# Patient Record
Sex: Female | Born: 1985 | Race: White | Hispanic: No | Marital: Married | Smoking: Never smoker
Health system: Southern US, Community
[De-identification: ages and names within clinical notes are randomized; demographics above are authoritative.]

## PROBLEM LIST (undated history)

## (undated) DIAGNOSIS — K219 Gastro-esophageal reflux disease without esophagitis: Secondary | ICD-10-CM

## (undated) DIAGNOSIS — Z8719 Personal history of other diseases of the digestive system: Secondary | ICD-10-CM

---

## 2014-09-08 ENCOUNTER — Encounter (HOSPITAL_COMMUNITY): Payer: Self-pay

## 2014-09-08 ENCOUNTER — Other Ambulatory Visit (HOSPITAL_COMMUNITY): Payer: Self-pay | Admitting: Specialist

## 2014-09-08 ENCOUNTER — Ambulatory Visit (HOSPITAL_COMMUNITY)
Admission: RE | Admit: 2014-09-08 | Discharge: 2014-09-08 | Disposition: A | Discharge status: Home / Self Care | Payer: BLUE CROSS/BLUE SHIELD | Source: Ambulatory Visit | Attending: Specialist | Admitting: Specialist

## 2014-09-08 DIAGNOSIS — Z36 Encounter for antenatal screening of mother: Secondary | ICD-10-CM | POA: Insufficient documentation

## 2014-09-08 DIAGNOSIS — O358XX0 Maternal care for other (suspected) fetal abnormality and damage, not applicable or unspecified: Secondary | ICD-10-CM | POA: Insufficient documentation

## 2014-09-08 DIAGNOSIS — Z3A20 20 weeks gestation of pregnancy: Secondary | ICD-10-CM | POA: Diagnosis not present

## 2014-09-08 DIAGNOSIS — IMO0002 Reserved for concepts with insufficient information to code with codable children: Secondary | ICD-10-CM

## 2014-09-08 DIAGNOSIS — O35BXX0 Maternal care for other (suspected) fetal abnormality and damage, fetal cardiac anomalies, not applicable or unspecified: Secondary | ICD-10-CM

## 2014-09-08 DIAGNOSIS — Z0489 Encounter for examination and observation for other specified reasons: Secondary | ICD-10-CM

## 2014-09-08 DIAGNOSIS — Z3689 Encounter for other specified antenatal screening: Secondary | ICD-10-CM | POA: Insufficient documentation

## 2014-09-11 ENCOUNTER — Other Ambulatory Visit (HOSPITAL_COMMUNITY): Payer: Self-pay

## 2014-09-15 ENCOUNTER — Encounter: Payer: Self-pay | Admitting: Specialist

## 2014-09-18 ENCOUNTER — Other Ambulatory Visit (HOSPITAL_COMMUNITY): Payer: Self-pay

## 2014-09-18 ENCOUNTER — Telehealth (HOSPITAL_COMMUNITY): Payer: Self-pay | Admitting: MS"

## 2014-09-18 NOTE — Telephone Encounter (Signed)
Called Maria Landry to discuss her cell free fetal DNA test results.  Mrs. Maria Landry had [facility] testing through [facility].  Testing was offered because of the ultrasound finding of fetal heart defect.   The patient was identified by name and DOB.  We reviewed that these are within normal limits, showing a less than 1 in 10,000 risk for trisomies 21, 18 and 13, and monosomy X (Turner syndrome).  In addition, the risk for triploidy/vanishing twin and sex chromosome trisomies (47,XXX and 47,XXY) was also low risk.  Mrs. Maria Landry elected to have cffDNA analysis for 22q11 deletion syndrome, which was also low risk (1 in 13,300).  We reviewed that this testing identifies > 99% of pregnancies with trisomy 8221, trisomy 8213, sex chromosome trisomies (47,XXX and 47,XXY), and triploidy. The detection rate for trisomy 18 is 96%.  The detection rate for monosomy X is ~92%.  The false positive rate is <0.1% for all conditions. Testing was also consistent with female fetal sex. She understands that this testing does not identify all genetic conditions.   Ms. Maria Landry [facility] fetal [facility]. She reported that this exam also visualized that "the atrial septum did not form, that there is one big valve instead of two, and that there may be a small hole between the other two septums", but that they were not able to see this well at that time. She stated that she has a follow-up planned with [facility] in 6 weeks. We reviewed that follow-up ultrasounds for fetal growth were also recommended at the time of her ultrasound in our office and that she can discuss with her OB if they would like for those to be performed through her OB office or our office. All questions were answered to her satisfaction, she was encouraged to call with additional questions or concerns.  Maria PlowmanKaren Ema Hebner, MS Patent attorneyCertified Genetic Counselor

## 2015-07-14 ENCOUNTER — Encounter (HOSPITAL_COMMUNITY): Payer: Self-pay | Admitting: *Deleted

## 2015-08-04 IMAGING — US US OB DETAIL+14 WK
1 series · 12 of 28 positions shown · non-contrast
Comparison: none

[Series 1: us ob detail+14 wk · 0.25mm/px · 12 of 100 slices shown]
[im 4/100]
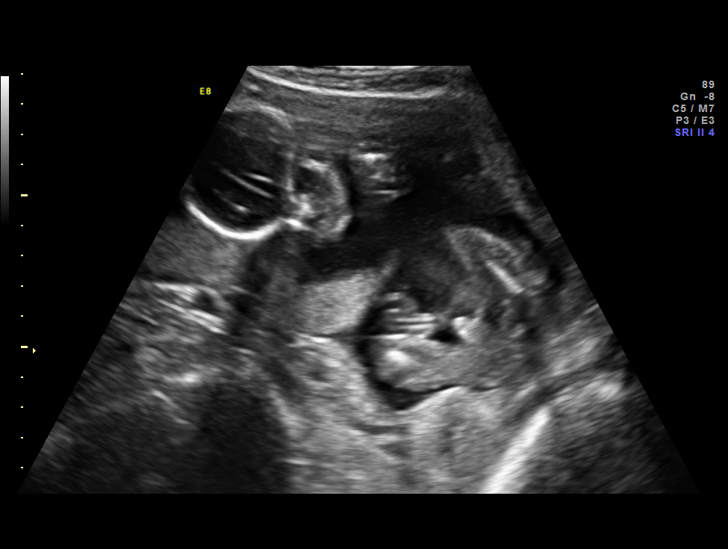
[im 12/100]
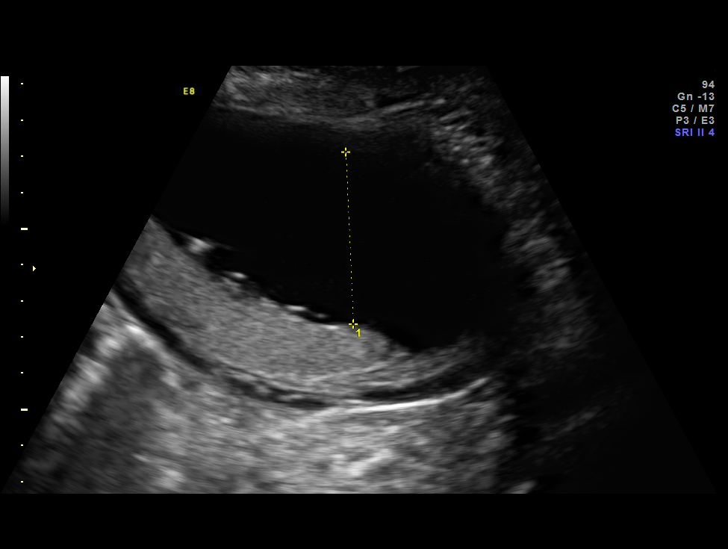
[im 19/100]
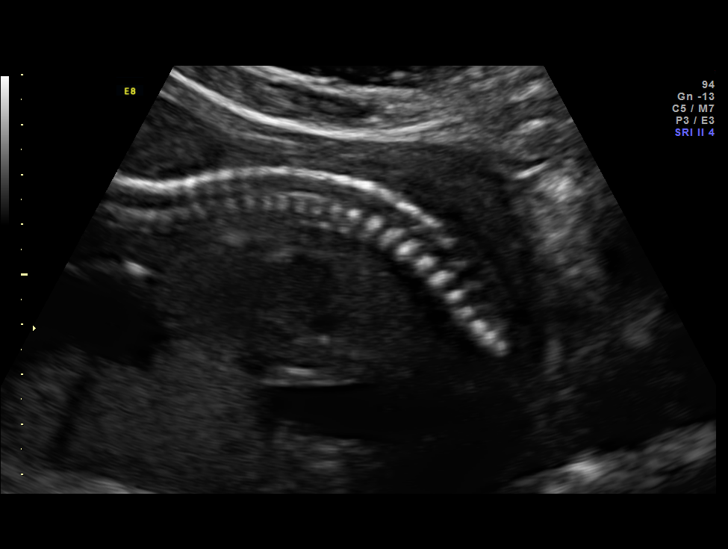
[im 30/100]
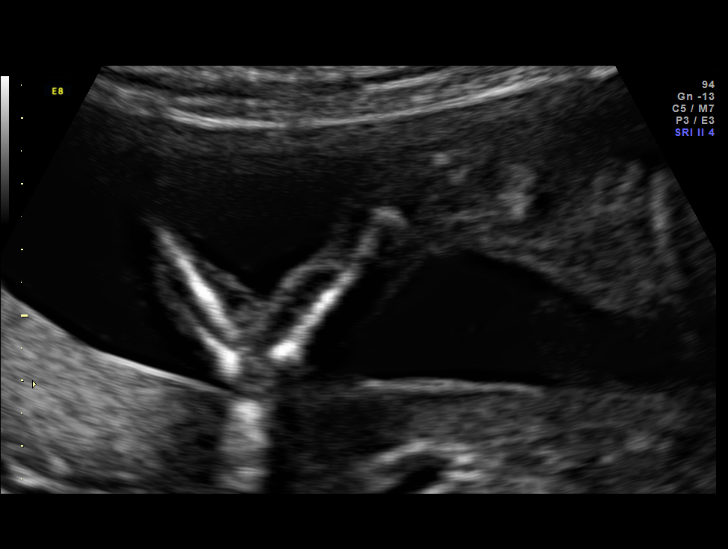
[im 37/100]
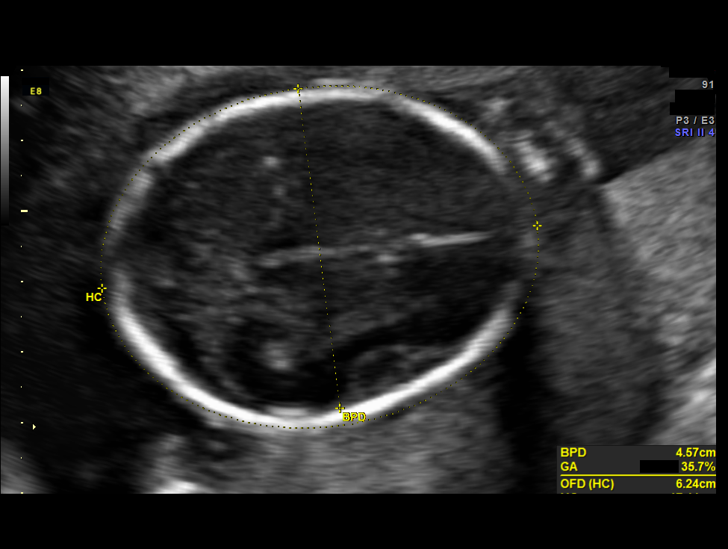
[im 45/100]
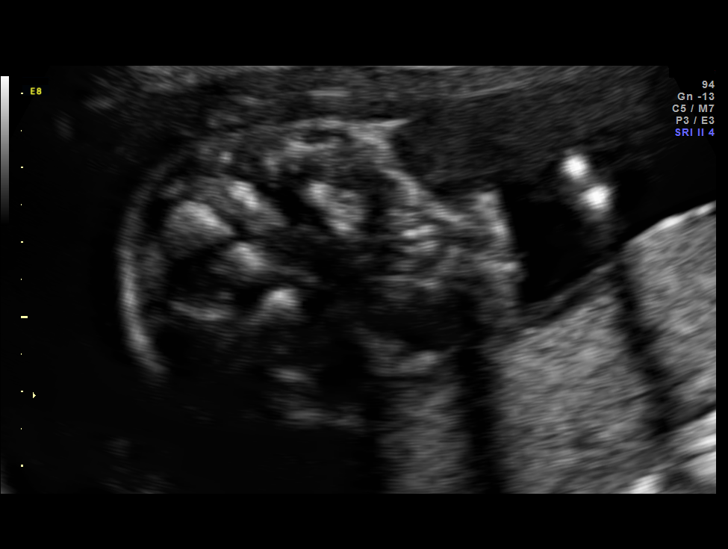
[im 56/100]
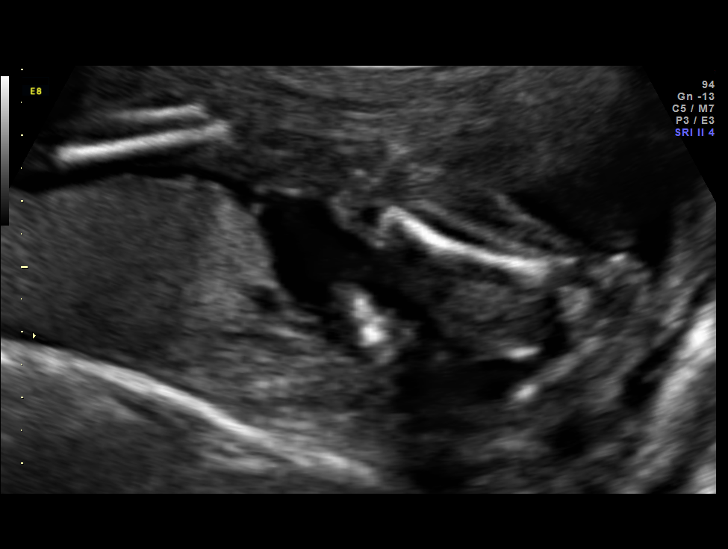
[im 63/100]
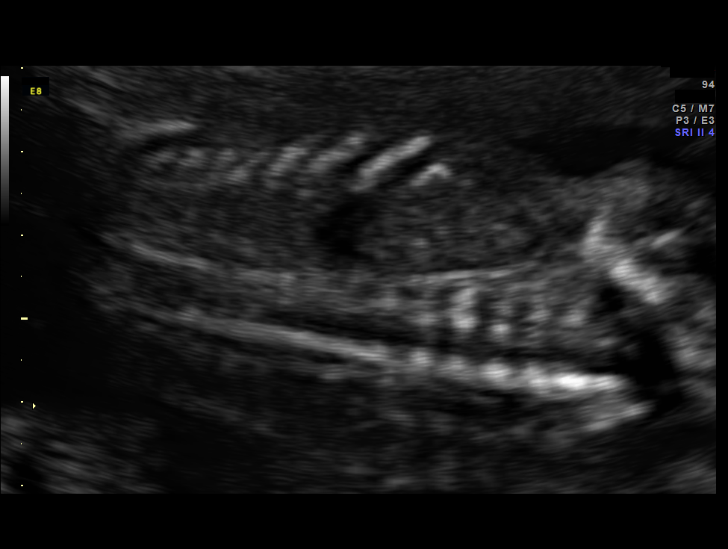
[im 70/100]
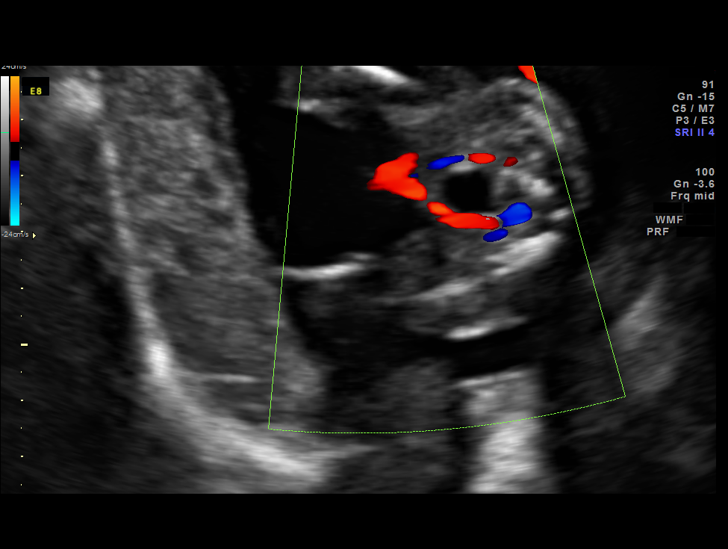
[im 81/100]
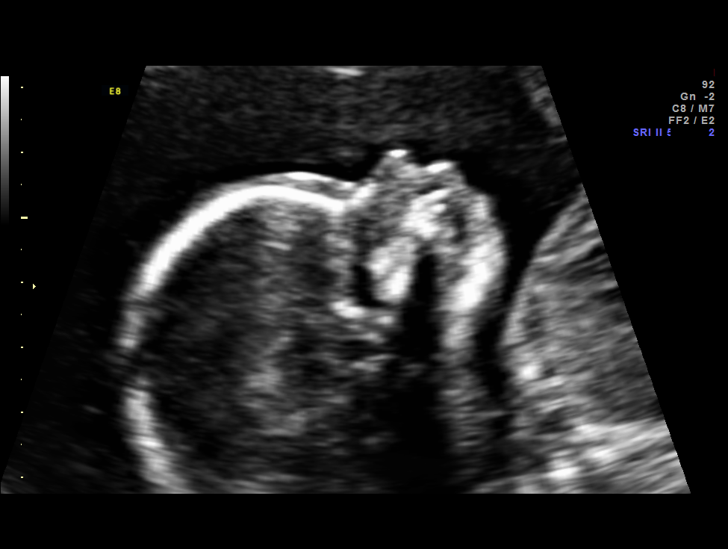
[im 89/100]
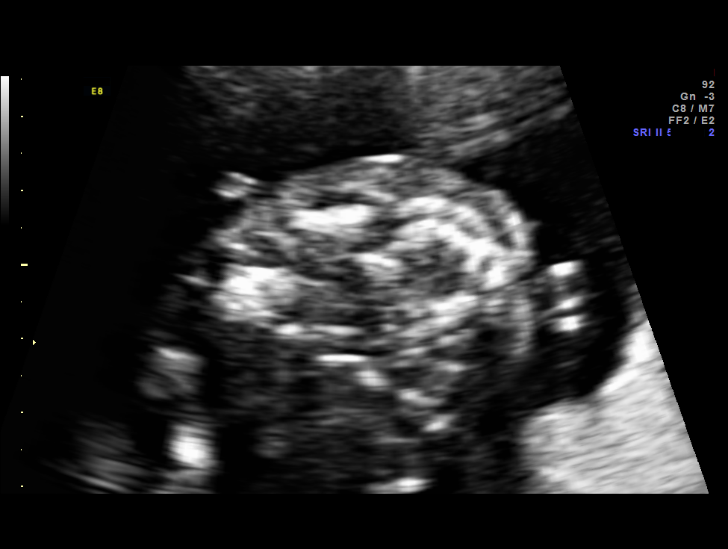
[im 96/100]
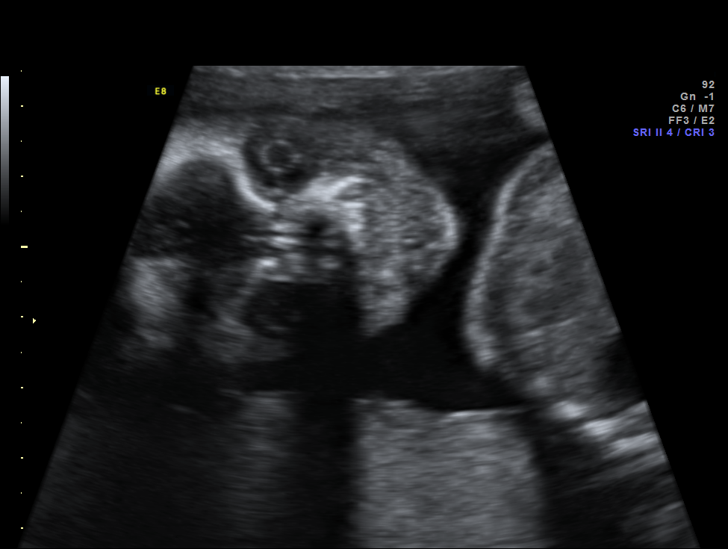

[12 of 28 positions shown; findings below may reference images not displayed]

OBSTETRICS REPORT
                      (Signed Final 09/08/2014 [DATE])

Service(s) Provided

 US OB DETAIL + 14 WK                                  76811.0
Indications

 Detailed fetal anatomic survey                        Z36
 Fetal abnormality - other known or suspected
 (heart defect)
 20 weeks gestation of pregnancy
Fetal Evaluation

 Num Of Fetuses:    1
 Fetal Heart Rate:  159                          bpm
 Cardiac Activity:  Observed
 Presentation:      Breech
 Placenta:          Posterior, above cervical
                    os
 P. Cord            Visualized
 Insertion:

 Amniotic Fluid
 AFI FV:      Subjectively within normal limits
                                             Larg Pckt:     4.8  cm
Biometry

 BPD:     46.3  mm     G. Age:  20w 0d                CI:         74.2   70 - 86
 OFD:     62.4  mm                                    FL/HC:      17.5   16.8 -

 HC:     175.7  mm     G. Age:  20w 0d       39  %    HC/AC:      1.14   1.09 -

 AC:     154.7  mm     G. Age:  20w 5d       61  %    FL/BPD:
 FL:      30.8  mm     G. Age:  19w 4d       22  %    FL/AC:      19.9   20 - 24
 HUM:     30.4  mm     G. Age:  20w 0d       50  %
 CER:     21.7  mm     G. Age:  20w 5d       58  %

 Est. FW:     334  gm    0 lb 12 oz      49  %
Gestational Age

 LMP:           22w 2d        Date:  04/05/14                 EDD:   01/10/15
 U/S Today:     20w 0d                                        EDD:   01/26/15
 Best:          20w 1d     Det. By:  Early Ultrasound         EDD:   01/25/15
                                     (06/15/14)
Anatomy

 Cranium:          Appears normal         Aortic Arch:      Appears normal
 Fetal Cavum:      Appears normal         Ductal Arch:      Not well visualized
 Ventricles:       Appears normal         Diaphragm:        Appears normal
 Choroid Plexus:   Appears normal         Stomach:          Appears normal, left
                                                            sided
 Cerebellum:       Appears normal         Abdomen:          Appears normal
 Posterior Fossa:  Appears normal         Abdominal Wall:   Appears nml (cord
                                                            insert, abd wall)
 Nuchal Fold:      Not applicable (>20    Cord Vessels:     Appears normal (3
                   wks GA)                                  vessel cord)
 Face:             Appears normal         Kidneys:          Appear normal
                   (orbits and profile)
 Lips:             Appears normal         Bladder:          Appears normal
 Palate:           Appears normal         Spine:            Appears normal
 Heart:            Abnml 4 chamber,       Lower             Appears normal
                   see comments
                                          Extremities:
 RVOT:             Appears normal         Upper             Appears normal
                                          Extremities:
 LVOT:             Appears normal

 Other:  Heels and 5th digit appear normal. Fetus appears to be a male.
Cervix Uterus Adnexa

 Cervical Length:    3.1      cm

 Cervix:       Normal appearance by transabdominal scan. Appears
               closed, without funnelling.
 Left Ovary:    Within normal limits.
 Right Ovary:   Not visualized. No adnexal mass visualized.
Impression

 SIUP at 20+1 weeks
 Congenital heart defect: at least an ASD but findings
 suspicious for AV canal defect; appeared to be an isolated
 anomaly
 All other detailed fetal anatomy was seen and appeared
 normal
 Other markers of aneuploidy: none
 Normal amniotic fluid volume
 Measurements consistent with LMP dating

 The US findings were shared with Ms. Fojleta.  The
 implications of the above heart defect were discussed in
 detail. They understand that diagnosis needs to be verified by
 a pediatric cardiologist and at that point a plan will be made
 for delivery, follow-up and surgery. Overall prognosis is very
 good. All of their prenatal testing and pregnancy
 management options were reviewed.  After careful
 consideration, she declined amniocentesis but opted for cell
 free DNA screening for AJB,BR and 13 + 77q11 ([facility]).
 They wanted to work with [REDACTED] we will arrange a fetal
 ECHO appt with [facility] (no appts in [REDACTED]
 next week so will contact their office in [place]).
Recommendations

 Fetal ECHO and delivery location recommendations per
 [facility]
 Serial [place] for growth (
 q 6 weeks); we would be happy to
 see them again!

## 2017-04-02 ENCOUNTER — Other Ambulatory Visit: Payer: Self-pay | Admitting: Gastroenterology

## 2017-04-02 DIAGNOSIS — R1011 Right upper quadrant pain: Secondary | ICD-10-CM

## 2017-04-02 NOTE — Progress Notes (Signed)
Maria Guice MD 

## 2017-04-09 ENCOUNTER — Encounter (HOSPITAL_COMMUNITY): Payer: Self-pay | Admitting: Radiology

## 2017-04-09 ENCOUNTER — Encounter (HOSPITAL_COMMUNITY)
Admission: RE | Admit: 2017-04-09 | Discharge: 2017-04-09 | Disposition: A | Discharge status: Home / Self Care | Payer: BLUE CROSS/BLUE SHIELD | Source: Ambulatory Visit | Attending: Gastroenterology | Admitting: Gastroenterology

## 2017-04-09 DIAGNOSIS — R1011 Right upper quadrant pain: Secondary | ICD-10-CM | POA: Insufficient documentation

## 2017-04-09 MED ORDER — TECHNETIUM TC 99M MEBROFENIN IV KIT
5.5000 | PACK | Freq: Once | INTRAVENOUS | Status: AC | PRN
  Administered 2017-04-09: 5.5 via INTRAVENOUS

## 2017-06-17 HISTORY — PX: CHOLECYSTECTOMY: SHX55

## 2018-05-18 ENCOUNTER — Encounter (HOSPITAL_COMMUNITY): Payer: Self-pay

## 2018-05-24 ENCOUNTER — Other Ambulatory Visit (HOSPITAL_COMMUNITY): Payer: Self-pay | Admitting: Specialist

## 2018-05-24 DIAGNOSIS — O09299 Supervision of pregnancy with other poor reproductive or obstetric history, unspecified trimester: Secondary | ICD-10-CM

## 2018-05-24 DIAGNOSIS — Z3A19 19 weeks gestation of pregnancy: Secondary | ICD-10-CM

## 2018-05-24 DIAGNOSIS — Z3689 Encounter for other specified antenatal screening: Secondary | ICD-10-CM

## 2018-06-29 ENCOUNTER — Encounter (HOSPITAL_COMMUNITY): Payer: Self-pay

## 2018-07-05 ENCOUNTER — Encounter (HOSPITAL_COMMUNITY): Payer: Self-pay | Admitting: *Deleted

## 2018-07-06 ENCOUNTER — Ambulatory Visit (HOSPITAL_COMMUNITY)
Admission: RE | Admit: 2018-07-06 | Discharge: 2018-07-06 | Disposition: A | Discharge status: Home / Self Care | Payer: 59 | Source: Ambulatory Visit | Attending: Specialist | Admitting: Specialist

## 2018-07-06 ENCOUNTER — Encounter (HOSPITAL_COMMUNITY): Payer: Self-pay

## 2018-07-06 DIAGNOSIS — Z363 Encounter for antenatal screening for malformations: Secondary | ICD-10-CM | POA: Insufficient documentation

## 2018-07-06 DIAGNOSIS — O09812 Supervision of pregnancy resulting from assisted reproductive technology, second trimester: Secondary | ICD-10-CM | POA: Insufficient documentation

## 2018-07-06 DIAGNOSIS — Z3A19 19 weeks gestation of pregnancy: Secondary | ICD-10-CM | POA: Insufficient documentation

## 2018-07-06 DIAGNOSIS — O09292 Supervision of pregnancy with other poor reproductive or obstetric history, second trimester: Secondary | ICD-10-CM | POA: Diagnosis not present

## 2018-07-06 DIAGNOSIS — O09299 Supervision of pregnancy with other poor reproductive or obstetric history, unspecified trimester: Secondary | ICD-10-CM

## 2018-07-06 DIAGNOSIS — Z3689 Encounter for other specified antenatal screening: Secondary | ICD-10-CM

## 2018-07-06 HISTORY — DX: Personal history of other diseases of the digestive system: Z87.19

## 2018-07-06 HISTORY — DX: Gastro-esophageal reflux disease without esophagitis: K21.9

## 2018-07-06 NOTE — Consult Note (Signed)
Ms. Maria Landry was seen for an outpatient high risk pregnancy consultation due to history of having a child with congenital heart defect.   She is a 32 year old G2P1001 at 7719 weeks and 4 days gestational age.   Her obstetric and gynecology history includes a term vaginal delivery in 2016. That child was antenatally diagnosed with partial atrial septal defect which was surgically managed just before his 2nd year of life. No parenteral history of congenital heart defect is reported.  No other  significant past medical and or surgical history is reported except for laparoscopic cholecystectomy in 2018. She denies smoking, alcohol intake or use of illicit drugs. No food or drug allergy history is stated. 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   Ultrasound performed today reveal a live mid-trimester gestation with normal placenta location and amniotic fluid volume.Normal fetal structures within limitations of obstetric ultrasound of visualizaed fetal structures. Fetal spine was suboptimally viewed.  Normal maternal vital signs noted.  Risk of fetal CHD with a history of CHD in a sibling has been stated by most experts to be 5-7%. Although no cardiac anomaly was observed today, a fetal echocardiogram is indicated. [facility]  Although fetal spine was suboptimally viewed, Ms. Maria Landry would prefer re-evaluation at her OB provider's office.  No further follow up with our team is needed at this time, unless otherwise indicated as pregnancy progresses.  I spent ~30 minutes in face to face time with this patient.  Blase MessHenry Adekola MD MFM

## 2019-02-18 ENCOUNTER — Encounter (HOSPITAL_COMMUNITY): Payer: Self-pay

## 2019-02-24 IMAGING — US US ABDOMEN COMPLETE
1 series · 13 of 25 positions shown · non-contrast
Comparison: None.

CLINICAL DATA: Right upper quadrant pain intermittently for the
past 7 years with most recent episode 3 weeks ago.

EXAM:
ABDOMEN ULTRASOUND COMPLETE

[Series 1: us abdomen complete · 0.20mm/px · 13 of 115 slices shown]
[im 1/115]
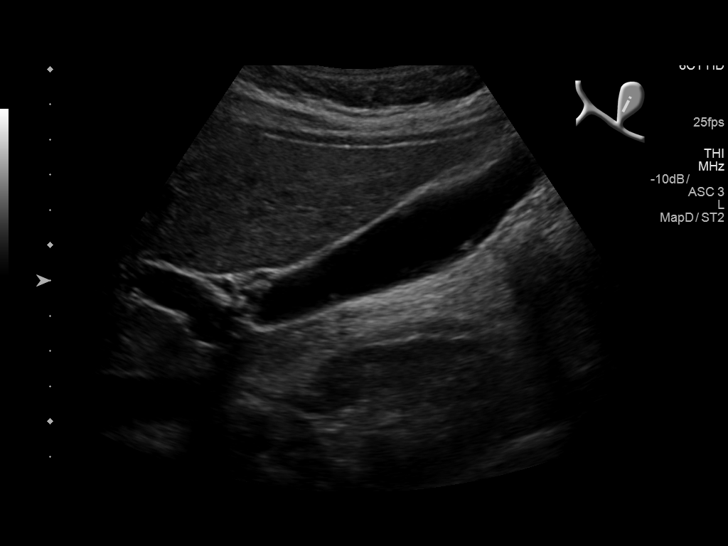
[im 10/115]
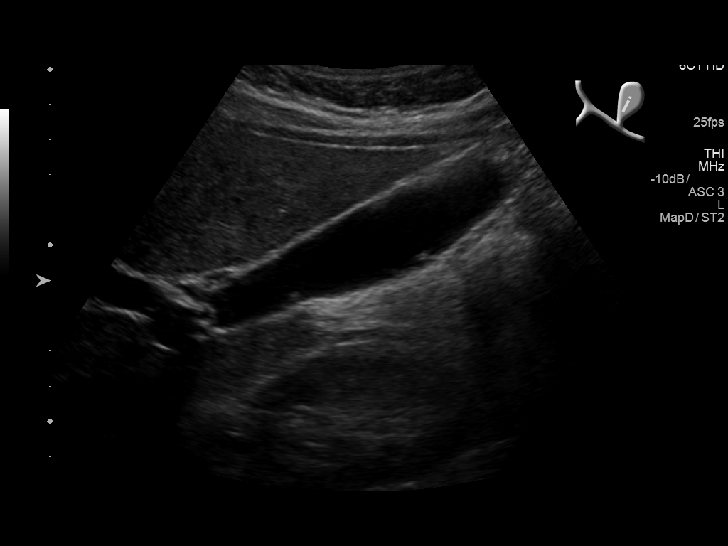
[im 20/115]
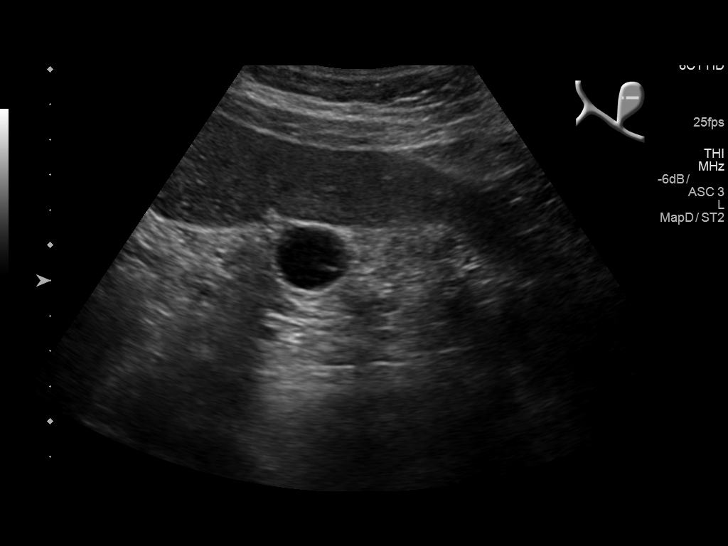
[im 29/115]
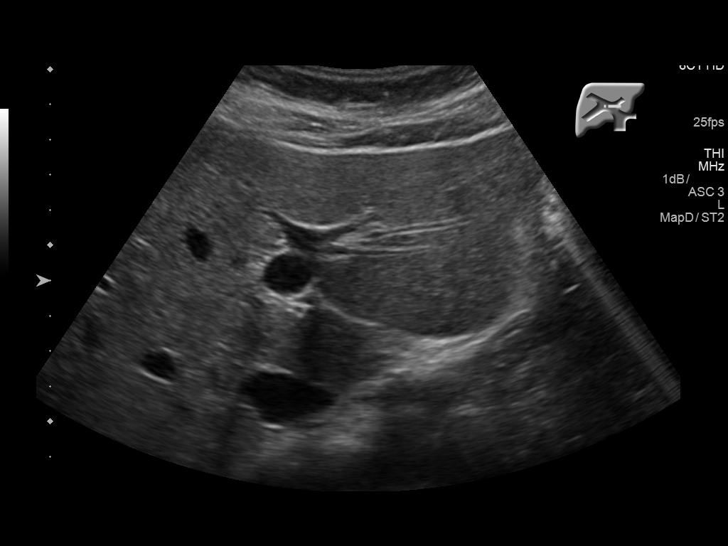
[im 39/115]
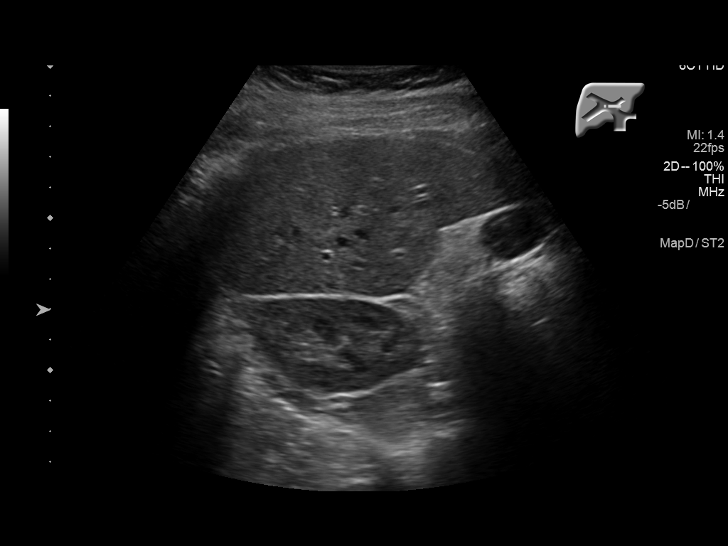
[im 48/115]
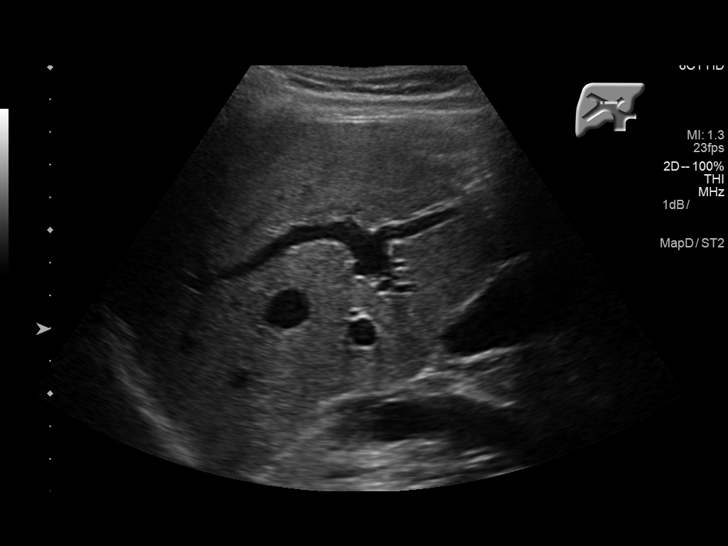
[im 58/115]
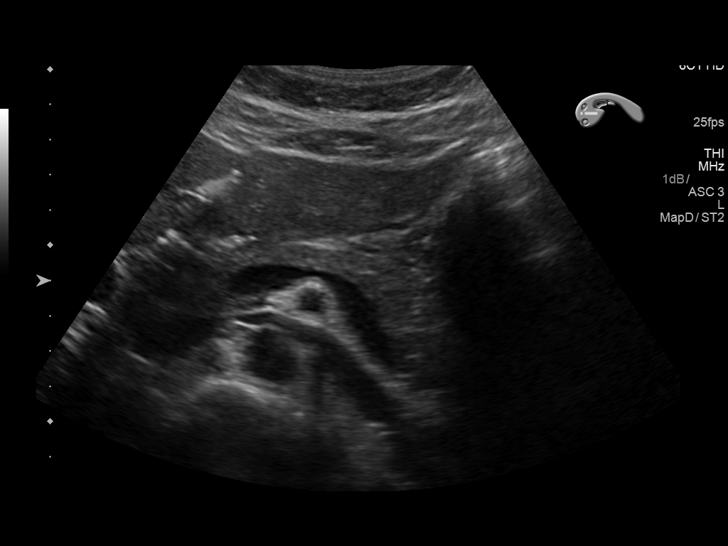
[im 67/115]
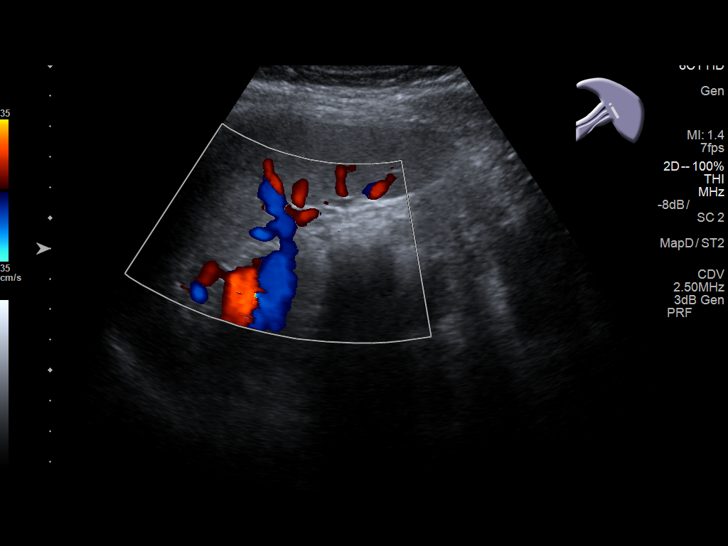
[im 77/115]
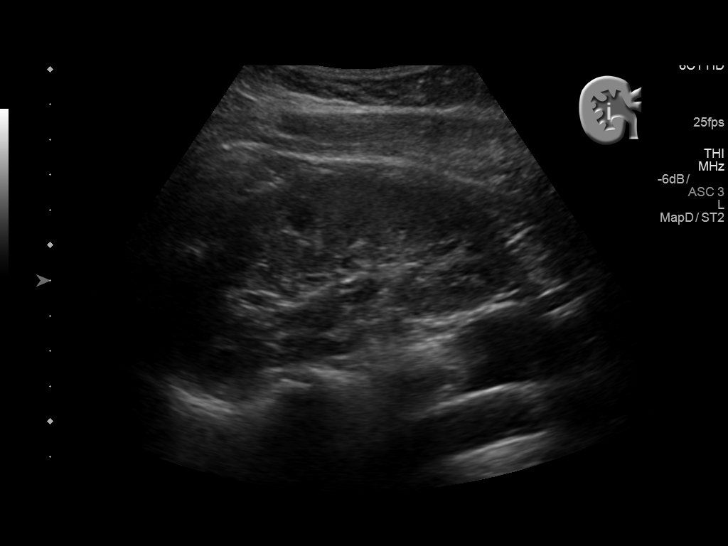
[im 86/115]
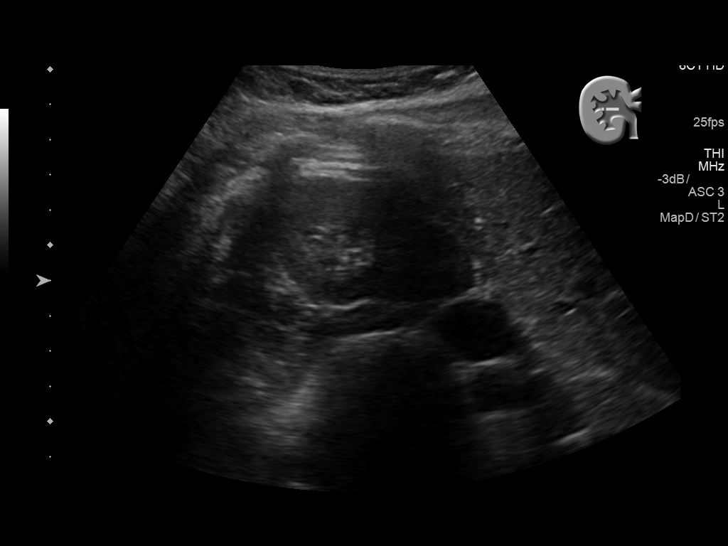
[im 96/115]
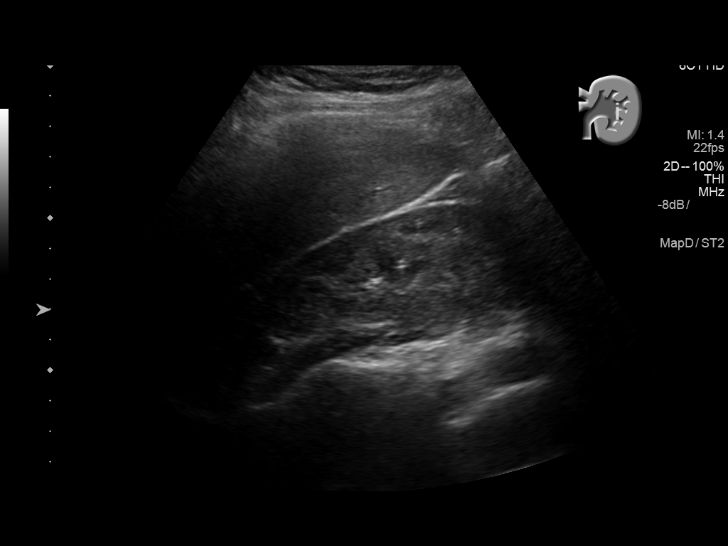
[im 105/115]
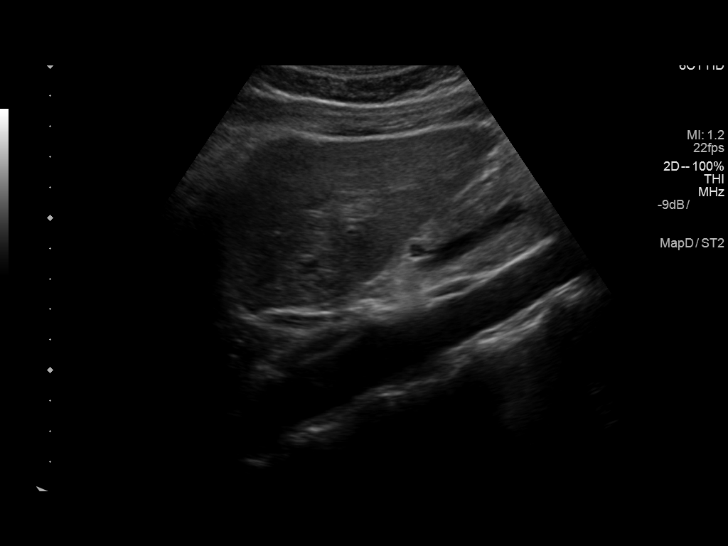
[im 115/115]
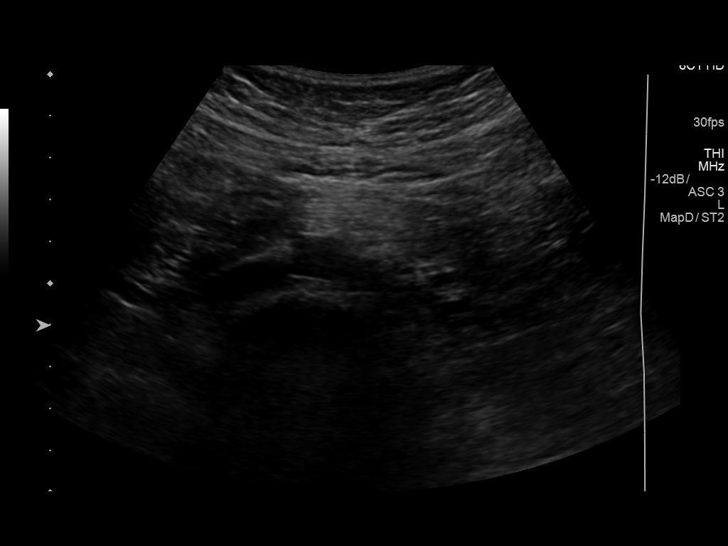

[13 of 25 positions shown; findings below may reference images not displayed]

FINDINGS: Gallbladder: The gallbladder is adequately distended. There are
echogenic non mobile nonshadowing foci measuring just over 3 mm in
diameter consistent with multiple polyps. No echogenic mobile
shadowing stones are observed. There is no gallbladder wall
thickening, pericholecystic fluid, or positive sonographic Murphy's
sign.

Common bile duct: Diameter: 2.8 mm

Liver: No focal lesion identified. Within normal limits in
parenchymal echogenicity. Portal vein is patent on color Doppler
imaging with normal direction of blood flow towards the liver.

IVC: No abnormality visualized.

Pancreas: Visualized portion unremarkable. There is limited
visualization of the pancreatic head.

Spleen: Size and appearance within normal limits.

Right Kidney: Length: 10.1 cm. Echogenicity within normal limits. No
mass or hydronephrosis visualized.

Left Kidney: Length: 10.5 cm. Echogenicity within normal limits. No
mass or hydronephrosis visualized.

Abdominal aorta: No aneurysm visualized.

Other findings: There is no ascites.
IMPRESSION: Multiple tiny gallbladder polyps. No stones or sludge. No
sonographic evidence of acute cholecystitis.

No acute abnormality observed within the abdomen.
